# Patient Record
Sex: Female | Born: 1940 | Race: White | Hispanic: No | Smoking: Never smoker
Health system: Southern US, Community
[De-identification: ages and names within clinical notes are randomized; demographics above are authoritative.]

## PROBLEM LIST (undated history)

## (undated) DIAGNOSIS — F419 Anxiety disorder, unspecified: Secondary | ICD-10-CM

---

## 1999-03-08 ENCOUNTER — Encounter (INDEPENDENT_AMBULATORY_CARE_PROVIDER_SITE_OTHER): Payer: Self-pay | Admitting: Specialist

## 1999-03-08 ENCOUNTER — Ambulatory Visit (HOSPITAL_COMMUNITY): Admission: RE | Admit: 1999-03-08 | Discharge: 1999-03-08 | Payer: Self-pay | Admitting: Obstetrics and Gynecology

## 2000-01-25 ENCOUNTER — Ambulatory Visit (HOSPITAL_COMMUNITY): Admission: RE | Admit: 2000-01-25 | Discharge: 2000-01-25 | Payer: Self-pay | Admitting: Family Medicine

## 2000-01-25 ENCOUNTER — Encounter: Payer: Self-pay | Admitting: Family Medicine

## 2000-02-09 ENCOUNTER — Ambulatory Visit (HOSPITAL_COMMUNITY): Admission: RE | Admit: 2000-02-09 | Discharge: 2000-02-09 | Payer: Self-pay | Admitting: Family Medicine

## 2000-02-09 ENCOUNTER — Encounter: Payer: Self-pay | Admitting: Family Medicine

## 2000-09-28 ENCOUNTER — Encounter: Payer: Self-pay | Admitting: Family Medicine

## 2000-09-28 ENCOUNTER — Encounter: Admission: RE | Admit: 2000-09-28 | Discharge: 2000-09-28 | Payer: Self-pay | Admitting: Family Medicine

## 2002-09-24 ENCOUNTER — Encounter: Payer: Self-pay | Admitting: Family Medicine

## 2002-09-24 ENCOUNTER — Encounter: Admission: RE | Admit: 2002-09-24 | Discharge: 2002-09-24 | Payer: Self-pay | Admitting: Family Medicine

## 2004-04-01 ENCOUNTER — Encounter: Admission: RE | Admit: 2004-04-01 | Discharge: 2004-04-01 | Payer: Self-pay | Admitting: Family Medicine

## 2005-06-16 ENCOUNTER — Encounter: Admission: RE | Admit: 2005-06-16 | Discharge: 2005-06-16 | Payer: Self-pay | Admitting: Family Medicine

## 2006-06-19 ENCOUNTER — Encounter: Admission: RE | Admit: 2006-06-19 | Discharge: 2006-06-19 | Payer: Self-pay | Admitting: Family Medicine

## 2008-10-28 ENCOUNTER — Ambulatory Visit: Payer: Self-pay | Admitting: Diagnostic Radiology

## 2008-10-28 ENCOUNTER — Ambulatory Visit (HOSPITAL_BASED_OUTPATIENT_CLINIC_OR_DEPARTMENT_OTHER): Admission: RE | Admit: 2008-10-28 | Discharge: 2008-10-28 | Payer: Self-pay | Admitting: Family Medicine

## 2008-11-17 ENCOUNTER — Ambulatory Visit: Payer: Self-pay | Admitting: Radiology

## 2008-11-17 ENCOUNTER — Ambulatory Visit (HOSPITAL_BASED_OUTPATIENT_CLINIC_OR_DEPARTMENT_OTHER): Admission: RE | Admit: 2008-11-17 | Discharge: 2008-11-17 | Payer: Self-pay | Admitting: Family Medicine

## 2010-01-25 ENCOUNTER — Ambulatory Visit (HOSPITAL_BASED_OUTPATIENT_CLINIC_OR_DEPARTMENT_OTHER): Admission: RE | Admit: 2010-01-25 | Discharge: 2010-01-25 | Payer: Self-pay | Admitting: Family Medicine

## 2010-01-25 ENCOUNTER — Ambulatory Visit: Payer: Self-pay | Admitting: Diagnostic Radiology

## 2015-10-04 ENCOUNTER — Emergency Department (HOSPITAL_COMMUNITY): Payer: Medicare Other

## 2015-10-04 ENCOUNTER — Encounter (HOSPITAL_COMMUNITY): Payer: Self-pay | Admitting: Emergency Medicine

## 2015-10-04 ENCOUNTER — Emergency Department (HOSPITAL_COMMUNITY)
Admission: EM | Admit: 2015-10-04 | Discharge: 2015-10-04 | Disposition: A | Discharge status: Home / Self Care | Payer: Medicare Other | Attending: Emergency Medicine | Admitting: Emergency Medicine

## 2015-10-04 DIAGNOSIS — S12400A Unspecified displaced fracture of fifth cervical vertebra, initial encounter for closed fracture: Secondary | ICD-10-CM | POA: Diagnosis not present

## 2015-10-04 DIAGNOSIS — Z7952 Long term (current) use of systemic steroids: Secondary | ICD-10-CM | POA: Insufficient documentation

## 2015-10-04 DIAGNOSIS — Z79899 Other long term (current) drug therapy: Secondary | ICD-10-CM | POA: Insufficient documentation

## 2015-10-04 DIAGNOSIS — T148XXA Other injury of unspecified body region, initial encounter: Secondary | ICD-10-CM

## 2015-10-04 DIAGNOSIS — S0993XA Unspecified injury of face, initial encounter: Secondary | ICD-10-CM | POA: Diagnosis not present

## 2015-10-04 DIAGNOSIS — Y998 Other external cause status: Secondary | ICD-10-CM | POA: Diagnosis not present

## 2015-10-04 DIAGNOSIS — W19XXXA Unspecified fall, initial encounter: Secondary | ICD-10-CM

## 2015-10-04 DIAGNOSIS — S6992XA Unspecified injury of left wrist, hand and finger(s), initial encounter: Secondary | ICD-10-CM | POA: Diagnosis present

## 2015-10-04 DIAGNOSIS — S52591A Other fractures of lower end of right radius, initial encounter for closed fracture: Secondary | ICD-10-CM | POA: Diagnosis not present

## 2015-10-04 DIAGNOSIS — S0083XA Contusion of other part of head, initial encounter: Secondary | ICD-10-CM | POA: Insufficient documentation

## 2015-10-04 DIAGNOSIS — Y9389 Activity, other specified: Secondary | ICD-10-CM | POA: Diagnosis not present

## 2015-10-04 DIAGNOSIS — W01198A Fall on same level from slipping, tripping and stumbling with subsequent striking against other object, initial encounter: Secondary | ICD-10-CM | POA: Diagnosis not present

## 2015-10-04 DIAGNOSIS — S129XXA Fracture of neck, unspecified, initial encounter: Secondary | ICD-10-CM

## 2015-10-04 DIAGNOSIS — F419 Anxiety disorder, unspecified: Secondary | ICD-10-CM | POA: Diagnosis not present

## 2015-10-04 DIAGNOSIS — Y9289 Other specified places as the place of occurrence of the external cause: Secondary | ICD-10-CM | POA: Diagnosis not present

## 2015-10-04 DIAGNOSIS — S52501A Unspecified fracture of the lower end of right radius, initial encounter for closed fracture: Secondary | ICD-10-CM

## 2015-10-04 HISTORY — DX: Anxiety disorder, unspecified: F41.9

## 2015-10-04 MED ORDER — HYDROMORPHONE HCL 1 MG/ML IJ SOLN
1.0000 mg | Freq: Once | INTRAMUSCULAR | Status: AC
Start: 2015-10-04 — End: 2015-10-04
  Administered 2015-10-04: 1 mg via INTRAVENOUS
  Filled 2015-10-04: qty 1

## 2015-10-04 MED ORDER — FENTANYL CITRATE (PF) 100 MCG/2ML IJ SOLN
50.0000 ug | Freq: Once | INTRAMUSCULAR | Status: AC
  Administered 2015-10-04: 50 ug via INTRAVENOUS
  Filled 2015-10-04: qty 2

## 2015-10-04 MED ORDER — LIDOCAINE HCL (PF) 1 % IJ SOLN
10.0000 mL | Freq: Once | INTRAMUSCULAR | Status: AC
  Administered 2015-10-04: 10 mL
  Filled 2015-10-04: qty 10

## 2015-10-04 MED ORDER — PROPOFOL 10 MG/ML IV BOLUS
2.0000 mg/kg | Freq: Once | INTRAVENOUS | Status: AC
  Administered 2015-10-04: 40 mg via INTRAVENOUS
  Filled 2015-10-04: qty 20

## 2015-10-04 MED ORDER — HYDROCODONE-ACETAMINOPHEN 5-325 MG PO TABS: 1.0000 | ORAL_TABLET | ORAL | Status: AC | PRN

## 2015-10-04 MED ORDER — SODIUM CHLORIDE 0.9 % IV SOLN
INTRAVENOUS | Status: AC | PRN
  Administered 2015-10-04: 1000 mL via INTRAVENOUS

## 2015-10-04 NOTE — Progress Notes (Signed)
Orthopedic Tech Progress Note Patient Details:  Berenice Primasvelyn R Hackenberg 03/20/1941 [id number]  Ortho Devices Type of Ortho Device: Ace wrap, Arm sling, Sugartong splint Ortho Device/Splint Interventions: Application   Saul FordyceJennifer C Mirakle Tomlin 10/04/2015, 7:17 PM

## 2015-10-04 NOTE — Discharge Instructions (Signed)
You need to wear your neck brace at all times until you see neurosurgery in 1 week. Please follow up with orthopedics for your wrist fracture.  Please return to the ED if you have worsening pain or numbness in your right hand  Cast or Splint Care Casts and splints support injured limbs and keep bones from moving while they heal. It is important to care for your cast or splint at home.  HOME CARE INSTRUCTIONS  Keep the cast or splint uncovered during the drying period. It can take 24 to 48 hours to dry if it is made of plaster. A fiberglass cast will dry in less than 1 hour.  Do not rest the cast on anything harder than a pillow for the first 24 hours.  Do not put weight on your injured limb or apply pressure to the cast until your health care provider gives you permission.  Keep the cast or splint dry. Wet casts or splints can lose their shape and may not support the limb as well. A wet cast that has lost its shape can also create harmful pressure on your skin when it dries. Also, wet skin can become infected.  Cover the cast or splint with a plastic bag when bathing or when out in the rain or snow. If the cast is on the trunk of the body, take sponge baths until the cast is removed.  If your cast does become wet, dry it with a towel or a blow dryer on the cool setting only.  Keep your cast or splint clean. Soiled casts may be wiped with a moistened cloth.  Do not place any hard or soft foreign objects under your cast or splint, such as cotton, toilet paper, lotion, or powder.  Do not try to scratch the skin under the cast with any object. The object could get stuck inside the cast. Also, scratching could lead to an infection. If itching is a problem, use a blow dryer on a cool setting to relieve discomfort.  Do not trim or cut your cast or remove padding from inside of it.  Exercise all joints next to the injury that are not immobilized by the cast or splint. For example, if you have a  long leg cast, exercise the hip joint and toes. If you have an arm cast or splint, exercise the shoulder, elbow, thumb, and fingers.  Elevate your injured arm or leg on 1 or 2 pillows for the first 1 to 3 days to decrease swelling and pain.It is best if you can comfortably elevate your cast so it is higher than your heart. SEEK MEDICAL CARE IF:   Your cast or splint cracks.  Your cast or splint is too tight or too loose.  You have unbearable itching inside the cast.  Your cast becomes wet or develops a soft spot or area.  You have a bad smell coming from inside your cast.  You get an object stuck under your cast.  Your skin around the cast becomes red or raw.  You have new pain or worsening pain after the cast has been applied. SEEK IMMEDIATE MEDICAL CARE IF:   You have fluid leaking through the cast.  You are unable to move your fingers or toes.  You have discolored (blue or white), cool, painful, or very swollen fingers or toes beyond the cast.  You have tingling or numbness around the injured area.  You have severe pain or pressure under the cast.  You have any  difficulty with your breathing or have shortness of breath.  You have chest pain.   This information is not intended to replace advice given to you by your health care provider. Make sure you discuss any questions you have with your health care provider.   Document Released: 09/02/2000 Document Revised: 06/26/2013 Document Reviewed: 03/14/2013 Elsevier Interactive Patient Education 2016 [employer]    Cervical Spine Fracture, Stable A cervical spine fracture is a break or crack in one of the bones of the neck. A fracture is stable if the chances of it causing you problems while it is healing are very small. CAUSES   Vehicle accidents.  Injuries from sports such as diving, football, biking, wrestling, or skiing.  Occasionally, severe osteoporosis or other bone diseases, such as cancers that spread to  bone or metabolic abnormalities. SYMPTOMS   Severe neck pain after an accident or fall.  Pain down your shoulders or arms.  Bruising or swelling on the back of your neck.  Numbness, tingling, muscle spasm, or weakness. DIAGNOSIS  Cervical spine fracture is diagnosed with the help of X-ray exams of your neck. Often a CT scan or MRI is used to confirm the diagnosis and help determine how your injury should be treated. Generally, an examination of your neck, arms, and legs, and the history of your injury prompts the health care provider to order these tests.  TREATMENT  A stable fracture needs to be treated with a brace or cervical collar. A cervical collar is a two-piece collar designed to keep your neck from moving during the healing process. HOME CARE INSTRUCTIONS  Limit physical activity to prevent worsening of the fracture.  Apply ice to areas of pain 3-4 times a day for 2 days.  Put ice in a bag.  Place a towel between your skin and the bag.  Leave the ice on for 15-20 minutes, 3-4 times a day.  You may have been given a cervical collar to wear.  Do not remove the collar unless instructed by your health care provider.  If you have long hair, keep it outside of the collar.  Ask your health care provider before making any adjustments to your collar. Minor adjustments may be required over time to improve comfort and reduce pressure on your chin or on the back of your head.  Keep your collar clean by wiping it with mild soap and water and drying it completely. The pads can be hand washed with soap and water and air dried completely.  If you are allowed to remove the collar for cleaning or bathing, follow your health care provider's instructions on how to do so safely.  If you are allowed to remove the collar for cleaning and bathing, wash and dry the skin of your neck. Check your skin for irritation or sores. If you see any, tell your health care provider.  Only take  over-the-counter or prescription medicines for pain, discomfort, or fever as directed by your health care provider.   Keep all follow-up appointments as directed by your health care provider. Not keeping an appointment could result in a chronic or permanent injury, pain, and disability. Additionally, X-rays or an MRI may be repeated 1-3 weeks after your initial appointment. This is to:  Make sure any other breaks or cracks were not missed.   Help identify stretched or torn ligaments.   Get your test results if you did not get them when you were first evaluated. The results will determine whether you need  other tests or treatment. It is your responsibility to get the results. SEEK MEDICAL CARE IF: You have irritation or sores on your skin from the cervical collar. SEEK IMMEDIATE MEDICAL CARE IF:   You have increasing pain in your neck.   You develop difficulties swallowing or breathing.  You develop swelling in your neck.   You have numbness, weakness, burning pain, or movement problems in the arms or legs.   You are unable to control your bowel or bladder (incontinence).   You have problems with coordination or difficulty walking. MAKE SURE YOU:   Understand these instructions.  Will watch your condition.  Will get help right away if you are not doing well or get worse.   This information is not intended to replace advice given to you by your health care provider. Make sure you discuss any questions you have with your health care provider.   Document Released: 07/23/2004 Document Revised: 09/10/2013 Document Reviewed: 04/01/2013 Elsevier Interactive Patient Education 2016 [employer] Radial Fracture A radial fracture is a break in the radius bone, which is the long bone of the forearm that is on the same side as your thumb. Your forearm is the part of your arm that is between your elbow and your wrist. It is made up of two bones: the radius and the ulna. Most radial  fractures occur near the wrist (distal radialfracture) or near the elbow (radial head fracture). A distal radial fracture is the most common type of broken arm. This fracture usually occurs about an inch above the wrist. Fractures of the middle part of the bone are less common. CAUSES  Falling with your arm outstretched is the most common cause of a radial fracture. Other causes include:  Car accidents.  Bike accidents.  A direct blow to the middle part of the radius. RISK FACTORS  You may be at greater risk for a distal radial fracture if you are 75 years of age or older.  You may be at greater risk for a radial head fracture if you are:  Female.  4430-75 years old.  You may be at a greater risk for all types of radial fractures if you have a condition that causes your bones to be weak or thin (osteoporosis). SIGNS AND SYMPTOMS A radial fracture causes pain immediately after the injury. Other signs and symptoms include:  An abnormal bend or bump in your arm (deformity).  Swelling.  Bruising.  Numbness or tingling.  Tenderness.  Limited movement. DIAGNOSIS  Your health care provider may diagnose a radial fracture based on:  Your symptoms.  Your medical history, including any recent injury.  A physical exam. Your health care provider will look for any deformity and feel for tenderness over the break. Your health care provider will also check whether the bone is out of place.  An X-ray exam to confirm the diagnosis and learn more about the type of fracture. TREATMENT The goals of treatment are to get the bone in proper position for healing and to keep it from moving so it will heal over time. Your treatment will depend on many factors, especially the type of fracture that you have.  If the fractured bone:  Is in the correct position (nondisplaced), you may only need to wear a cast or a splint.  Has a slightly displaced fracture, you may need to have the bones moved  back into place manually (closed reduction) before the splint or cast is put on.  You may have  a temporary splint before you have a plaster cast. The splint allows room for some swelling. After a few days, a cast can replace the splint.  You may have to wear the cast for about 6 weeks or as directed by your health care provider.  The cast may be changed after about 3 weeks or as directed by your health care provider.  After your cast is taken off, you may need physical therapy to regain full movement in your wrist or elbow.  You may need emergency surgery if you have:  A fractured bone that is out of position (displaced).  A fracture with multiple fragments (comminuted fracture).  A fracture that breaks the skin (open fracture). This type of fracture may require surgical wires, plates, or screws to hold the bone in place.  You may have X-rays every couple of weeks to check on your healing. HOME CARE INSTRUCTIONS  Keep the injured arm above the level of your heart while you are sitting or lying down. This helps to reduce swelling and pain.  Apply ice to the injured area:  Put ice in a plastic bag.  Place a towel between your skin and the bag.  Leave the ice on for 20 minutes, 2-3 times per day.  Move your fingers often to avoid stiffness and to minimize swelling.  If you have a plaster or fiberglass cast:  Do not try to scratch the skin under the cast using sharp or pointed objects.  Check the skin around the cast every day. You may put lotion on any red or sore areas.  Keep your cast dry and clean.  If you have a plaster splint:  Wear the splint as directed.  Loosen the elastic around the splint if your fingers become numb and tingle, or if they turn cold and blue.  Do not put pressure on any part of your cast until it is fully hardened. Rest your cast only on a pillow for the first 24 hours.  Protect your cast or splint while bathing or showering, as directed by your  health care provider. Do not put your cast or splint into water.  Take medicines only as directed by your health care provider.  Return to activities, such as sports, as directed by your health care provider. Ask your health care provider what activities are safe for you.  Keep all follow-up visits as directed by your health care provider. This is important. SEEK MEDICAL CARE IF:  Your pain medicine is not helping.  Your cast gets damaged or it breaks.  Your cast becomes loose.  Your cast gets wet.  You have more severe pain or swelling than you did before the cast.  You have severe pain when stretching your fingers.  You continue to have pain or stiffness in your elbow or your wrist after your cast is taken off. SEEK IMMEDIATE MEDICAL CARE IF:  You cannot move your fingers.  You lose feeling in your fingers or your hand.  Your hand or your fingers turn cold and pale or blue.  You notice a bad smell coming from your cast.  You have drainage from underneath your cast.  You have new stains from blood or drainage seeping through your cast.   This information is not intended to replace advice given to you by your health care provider. Make sure you discuss any questions you have with your health care provider.   Document Released: 02/16/2006 Document Revised: 09/26/2014 Document Reviewed: 02/28/2014 Elsevier Interactive Patient  Education ©2016 [employer] ° °
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
## 2015-10-04 NOTE — ED Notes (Signed)
Pt face being cleaned by EMT.
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
## 2015-10-04 NOTE — ED Notes (Signed)
Pt c.o. Right wrist pain, left thumb pain, neck pain and head pain.

## 2015-10-04 NOTE — ED Notes (Signed)
Crash cart and airway cart set up at bedside for conscious sedation.

## 2015-10-08 ENCOUNTER — Other Ambulatory Visit (HOSPITAL_BASED_OUTPATIENT_CLINIC_OR_DEPARTMENT_OTHER): Payer: Self-pay | Admitting: Physical Medicine and Rehabilitation

## 2015-10-08 DIAGNOSIS — S12400A Unspecified displaced fracture of fifth cervical vertebra, initial encounter for closed fracture: Secondary | ICD-10-CM

## 2016-02-22 ENCOUNTER — Other Ambulatory Visit (HOSPITAL_BASED_OUTPATIENT_CLINIC_OR_DEPARTMENT_OTHER): Payer: Self-pay | Admitting: Family Medicine

## 2016-02-22 DIAGNOSIS — Z1231 Encounter for screening mammogram for malignant neoplasm of breast: Secondary | ICD-10-CM

## 2016-02-22 DIAGNOSIS — M810 Age-related osteoporosis without current pathological fracture: Secondary | ICD-10-CM

## 2016-03-01 ENCOUNTER — Ambulatory Visit (HOSPITAL_BASED_OUTPATIENT_CLINIC_OR_DEPARTMENT_OTHER): Payer: Medicare Other

## 2016-03-01 ENCOUNTER — Ambulatory Visit (HOSPITAL_BASED_OUTPATIENT_CLINIC_OR_DEPARTMENT_OTHER)
Admission: RE | Admit: 2016-03-01 | Discharge: 2016-03-01 | Disposition: A | Discharge status: Home / Self Care | Payer: Medicare Other | Source: Ambulatory Visit | Attending: Family Medicine | Admitting: Family Medicine

## 2016-03-01 DIAGNOSIS — M810 Age-related osteoporosis without current pathological fracture: Secondary | ICD-10-CM

## 2016-03-01 DIAGNOSIS — Z1231 Encounter for screening mammogram for malignant neoplasm of breast: Secondary | ICD-10-CM | POA: Insufficient documentation

## 2016-03-01 DIAGNOSIS — Z780 Asymptomatic menopausal state: Secondary | ICD-10-CM | POA: Diagnosis not present

## 2016-03-01 DIAGNOSIS — R928 Other abnormal and inconclusive findings on diagnostic imaging of breast: Secondary | ICD-10-CM | POA: Diagnosis not present

## 2016-03-02 ENCOUNTER — Other Ambulatory Visit (HOSPITAL_BASED_OUTPATIENT_CLINIC_OR_DEPARTMENT_OTHER): Payer: Medicare Other

## 2016-03-03 ENCOUNTER — Other Ambulatory Visit: Payer: Self-pay | Admitting: Family Medicine

## 2016-03-03 DIAGNOSIS — R928 Other abnormal and inconclusive findings on diagnostic imaging of breast: Secondary | ICD-10-CM

## 2016-04-08 ENCOUNTER — Other Ambulatory Visit: Payer: Medicare Other

## 2016-04-29 ENCOUNTER — Ambulatory Visit
Admission: RE | Admit: 2016-04-29 | Discharge: 2016-04-29 | Disposition: A | Discharge status: Home / Self Care | Payer: Medicare Other | Source: Ambulatory Visit | Attending: Family Medicine | Admitting: Family Medicine

## 2016-04-29 DIAGNOSIS — R928 Other abnormal and inconclusive findings on diagnostic imaging of breast: Secondary | ICD-10-CM

## 2017-06-05 ENCOUNTER — Other Ambulatory Visit (HOSPITAL_BASED_OUTPATIENT_CLINIC_OR_DEPARTMENT_OTHER): Payer: Self-pay | Admitting: Family Medicine

## 2017-06-05 DIAGNOSIS — Z1231 Encounter for screening mammogram for malignant neoplasm of breast: Secondary | ICD-10-CM

## 2017-06-12 ENCOUNTER — Ambulatory Visit (HOSPITAL_BASED_OUTPATIENT_CLINIC_OR_DEPARTMENT_OTHER)
Admission: RE | Admit: 2017-06-12 | Discharge: 2017-06-12 | Disposition: A | Discharge status: Home / Self Care | Payer: Medicare Other | Source: Ambulatory Visit | Attending: Family Medicine | Admitting: Family Medicine

## 2017-06-12 DIAGNOSIS — Z1231 Encounter for screening mammogram for malignant neoplasm of breast: Secondary | ICD-10-CM | POA: Insufficient documentation

## 2020-05-22 ENCOUNTER — Other Ambulatory Visit (HOSPITAL_BASED_OUTPATIENT_CLINIC_OR_DEPARTMENT_OTHER): Payer: Self-pay | Admitting: Family Medicine

## 2020-05-22 DIAGNOSIS — Z1231 Encounter for screening mammogram for malignant neoplasm of breast: Secondary | ICD-10-CM

## 2020-06-04 ENCOUNTER — Ambulatory Visit (HOSPITAL_BASED_OUTPATIENT_CLINIC_OR_DEPARTMENT_OTHER): Payer: Medicare Other

## 2020-06-05 ENCOUNTER — Ambulatory Visit (HOSPITAL_BASED_OUTPATIENT_CLINIC_OR_DEPARTMENT_OTHER): Payer: Medicare Other

## 2020-06-08 ENCOUNTER — Other Ambulatory Visit: Payer: Self-pay

## 2020-06-08 ENCOUNTER — Ambulatory Visit (HOSPITAL_BASED_OUTPATIENT_CLINIC_OR_DEPARTMENT_OTHER)
Admission: RE | Admit: 2020-06-08 | Discharge: 2020-06-08 | Disposition: A | Discharge status: Home / Self Care | Payer: Medicare PPO | Source: Ambulatory Visit | Attending: Family Medicine | Admitting: Family Medicine

## 2020-06-08 DIAGNOSIS — Z1231 Encounter for screening mammogram for malignant neoplasm of breast: Secondary | ICD-10-CM | POA: Insufficient documentation

## 2020-06-10 ENCOUNTER — Other Ambulatory Visit: Payer: Self-pay | Admitting: Family Medicine

## 2020-06-10 DIAGNOSIS — R928 Other abnormal and inconclusive findings on diagnostic imaging of breast: Secondary | ICD-10-CM

## 2020-06-24 ENCOUNTER — Other Ambulatory Visit: Payer: Medicare PPO

## 2020-06-26 ENCOUNTER — Other Ambulatory Visit: Payer: Self-pay

## 2020-06-26 ENCOUNTER — Ambulatory Visit
Admission: RE | Admit: 2020-06-26 | Discharge: 2020-06-26 | Disposition: A | Discharge status: Home / Self Care | Payer: Medicare PPO | Source: Ambulatory Visit | Attending: Family Medicine | Admitting: Family Medicine

## 2020-06-26 ENCOUNTER — Ambulatory Visit: Payer: Medicare PPO

## 2020-06-26 DIAGNOSIS — R928 Other abnormal and inconclusive findings on diagnostic imaging of breast: Secondary | ICD-10-CM

## 2021-10-07 ENCOUNTER — Other Ambulatory Visit (HOSPITAL_BASED_OUTPATIENT_CLINIC_OR_DEPARTMENT_OTHER): Payer: Self-pay | Admitting: Family Medicine

## 2021-10-07 ENCOUNTER — Other Ambulatory Visit: Payer: Self-pay | Admitting: Family Medicine

## 2021-10-07 DIAGNOSIS — Z1231 Encounter for screening mammogram for malignant neoplasm of breast: Secondary | ICD-10-CM

## 2021-10-08 ENCOUNTER — Ambulatory Visit (HOSPITAL_BASED_OUTPATIENT_CLINIC_OR_DEPARTMENT_OTHER)
Admission: RE | Admit: 2021-10-08 | Discharge: 2021-10-08 | Disposition: A | Discharge status: Home / Self Care | Payer: Medicare PPO | Source: Ambulatory Visit | Attending: Family Medicine | Admitting: Family Medicine

## 2021-10-08 ENCOUNTER — Encounter (HOSPITAL_BASED_OUTPATIENT_CLINIC_OR_DEPARTMENT_OTHER): Payer: Self-pay

## 2021-10-08 ENCOUNTER — Other Ambulatory Visit: Payer: Self-pay

## 2021-10-08 DIAGNOSIS — Z1231 Encounter for screening mammogram for malignant neoplasm of breast: Secondary | ICD-10-CM | POA: Diagnosis present

## 2022-06-21 IMAGING — MG MM DIGITAL DIAGNOSTIC UNILAT*L* W/ TOMO W/ CAD
4 series · 4 of 12 positions shown · non-contrast
Comparison: Previous exams including recent screening mammogram
dated 06/08/2020.

CLINICAL DATA: Patient returns today to evaluate a possible LEFT
breast asymmetry questioned on recent screening mammogram.

EXAM:
DIGITAL DIAGNOSTIC UNILATERAL LEFT MAMMOGRAM WITH TOMO AND CAD

[L CC synth-2D]
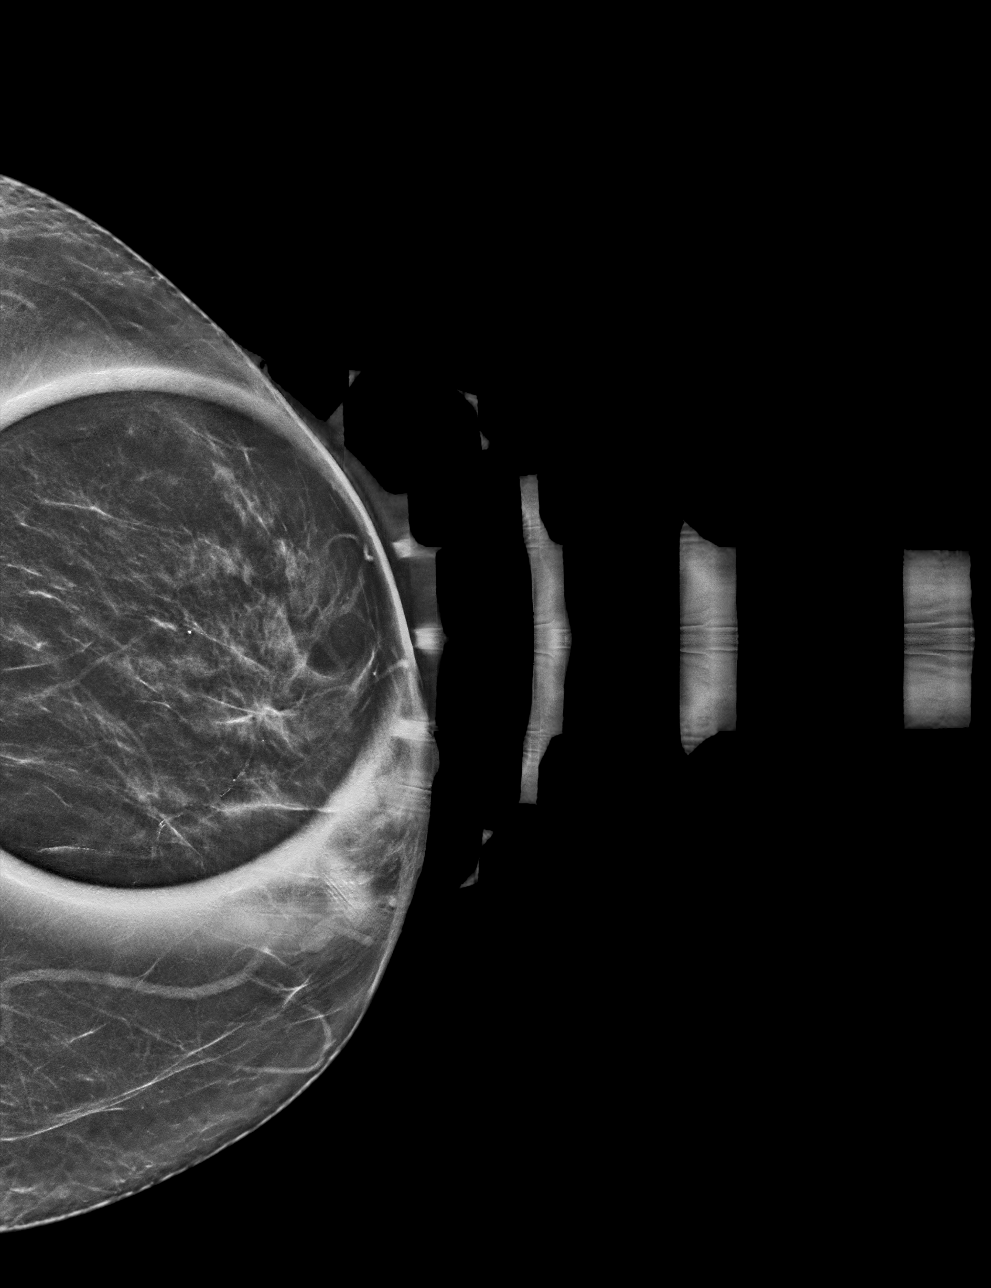

[L MLO synth-2D]
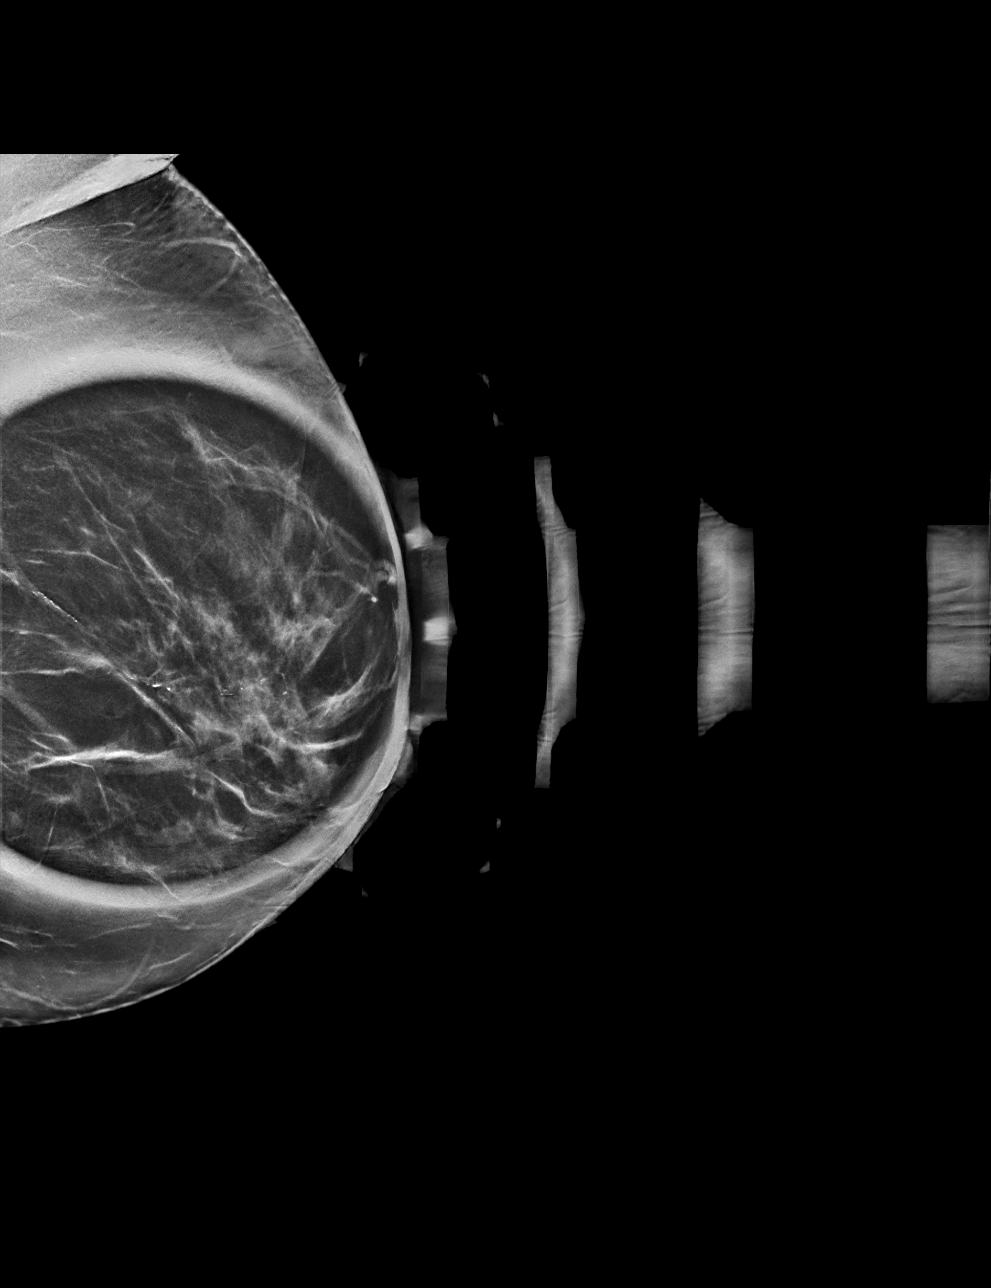

[L CC tomo · tomo slice 25/50.0]
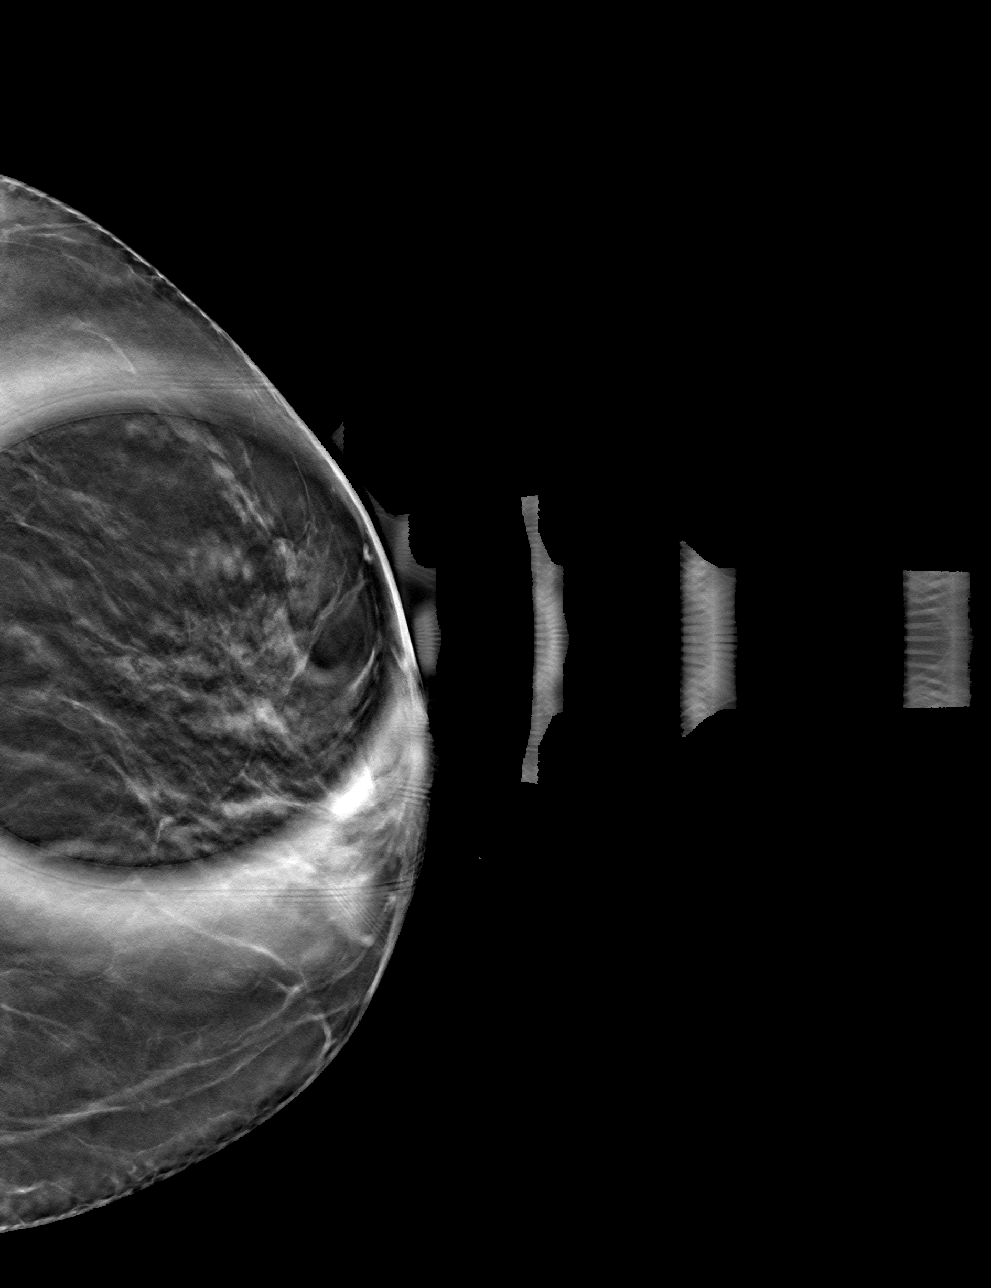

[L MLO tomo · tomo slice 31/61.0]
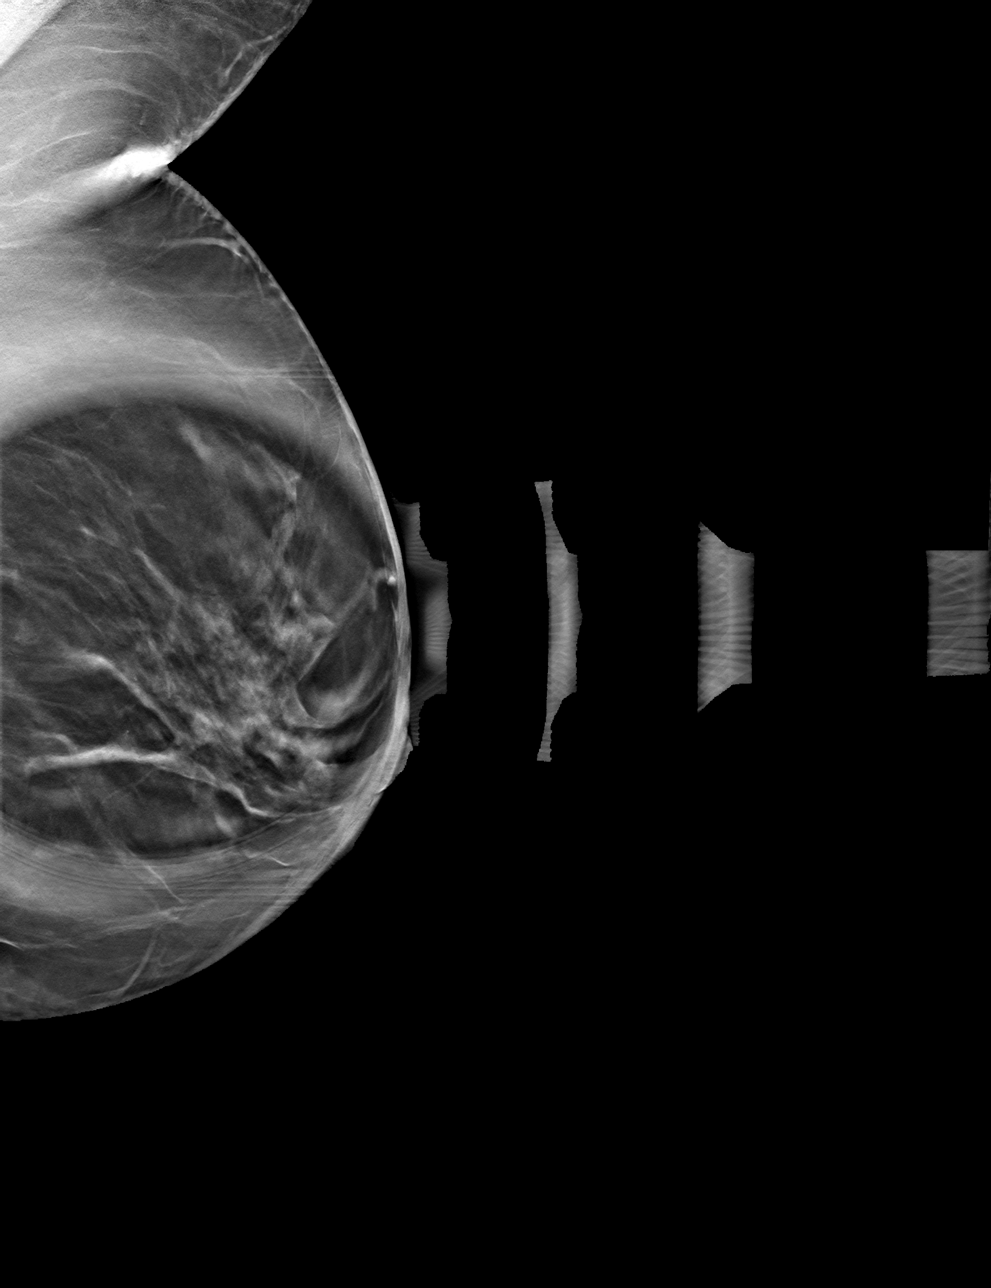

[4 of 12 positions shown; findings below may reference images not displayed]

ACR Breast Density Category b: There are scattered areas of
fibroglandular density.
FINDINGS: On today's additional diagnostic views with spot compression and 3D
tomosynthesis, there is no persistent asymmetry within the LEFT
breast indicating superimposition of normal dense fibroglandular
tissues.

Mammographic images were processed with CAD.
IMPRESSION: No evidence of malignancy. Patient may return to routine annual
bilateral screening mammogram schedule.

RECOMMENDATION:
Screening mammogram in one year.(Code:OE-[id number])

I have discussed the findings and recommendations with the patient.
If applicable, a reminder letter will be sent to the patient
regarding the next appointment.

BI-RADS CATEGORY  1: Negative.
